# Patient Record
Sex: Male | Born: 1945 | Race: White | Hispanic: No | Marital: Single | Smoking: Current every day smoker
Health system: Southern US, Community
[De-identification: ages and names within clinical notes are randomized; demographics above are authoritative.]

---

## 2017-02-28 ENCOUNTER — Emergency Department (HOSPITAL_COMMUNITY): Payer: Worker's Compensation

## 2017-02-28 ENCOUNTER — Emergency Department (HOSPITAL_COMMUNITY)
Admission: EM | Admit: 2017-02-28 | Discharge: 2017-02-28 | Disposition: A | Discharge status: Home / Self Care | Payer: Worker's Compensation | Attending: Emergency Medicine | Admitting: Emergency Medicine

## 2017-02-28 ENCOUNTER — Encounter (HOSPITAL_COMMUNITY): Payer: Self-pay | Admitting: Emergency Medicine

## 2017-02-28 DIAGNOSIS — Y9389 Activity, other specified: Secondary | ICD-10-CM | POA: Insufficient documentation

## 2017-02-28 DIAGNOSIS — S63004A Unspecified dislocation of right wrist and hand, initial encounter: Secondary | ICD-10-CM

## 2017-02-28 DIAGNOSIS — S63259A Unspecified dislocation of unspecified finger, initial encounter: Secondary | ICD-10-CM

## 2017-02-28 DIAGNOSIS — S63284A Dislocation of proximal interphalangeal joint of right ring finger, initial encounter: Secondary | ICD-10-CM | POA: Insufficient documentation

## 2017-02-28 DIAGNOSIS — F17200 Nicotine dependence, unspecified, uncomplicated: Secondary | ICD-10-CM | POA: Diagnosis not present

## 2017-02-28 DIAGNOSIS — Y990 Civilian activity done for income or pay: Secondary | ICD-10-CM | POA: Insufficient documentation

## 2017-02-28 DIAGNOSIS — S63094A Other dislocation of right wrist and hand, initial encounter: Secondary | ICD-10-CM | POA: Diagnosis not present

## 2017-02-28 DIAGNOSIS — Y92410 Unspecified street and highway as the place of occurrence of the external cause: Secondary | ICD-10-CM | POA: Insufficient documentation

## 2017-02-28 DIAGNOSIS — S8001XA Contusion of right knee, initial encounter: Secondary | ICD-10-CM

## 2017-02-28 DIAGNOSIS — S6991XA Unspecified injury of right wrist, hand and finger(s), initial encounter: Secondary | ICD-10-CM | POA: Diagnosis present

## 2017-02-28 MED ORDER — LIDOCAINE HCL (PF) 1 % IJ SOLN
5.0000 mL | Freq: Once | INTRAMUSCULAR | Status: AC
  Administered 2017-02-28: 5 mL via INTRADERMAL
  Filled 2017-02-28: qty 5

## 2017-02-28 MED ORDER — OXYCODONE-ACETAMINOPHEN 5-325 MG PO TABS
1.0000 | ORAL_TABLET | ORAL | Status: AC | PRN
  Administered 2017-02-28 (×2): 1 via ORAL
  Filled 2017-02-28 (×2): qty 1
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
## 2017-02-28 NOTE — Progress Notes (Signed)
Orthopedic Tech Progress Note Patient Details:  Rockney GheeWilliam Shevlin 04/23/1945 [id number]  Ortho Devices Type of Ortho Device: Ace wrap, Arm sling, Volar splint Ortho Device/Splint Interventions: Application   Post Interventions Patient Tolerated: Well Instructions Provided: Care of device   Saul FordyceJennifer C Mardene Lessig 02/28/2017, 11:58 AM

## 2017-02-28 NOTE — ED Notes (Signed)
Patient transported to X-ray 

## 2017-02-28 NOTE — ED Notes (Signed)
Pt returns from radiology. 
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
## 2017-02-28 NOTE — ED Notes (Signed)
Ortho paged, they report they will be down soon to apply short arm splint

## 2017-02-28 NOTE — ED Notes (Signed)
Pt requesting additional pain med. Francella SolianJamie B. RN made aware

## 2017-02-28 NOTE — ED Notes (Signed)
ED Provider at bedside. 
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
## 2017-02-28 NOTE — ED Notes (Signed)
Pt. Stated, Im beginning to have right knee pain

## 2017-02-28 NOTE — ED Notes (Signed)
Report from Kari, RN 

## 2017-02-28 NOTE — ED Notes (Signed)
Ortho tech paged  
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
